# Patient Record
Sex: Female | Born: 1955 | Race: White | Hispanic: No | Marital: Married
Health system: Southern US, Community
[De-identification: ages and names within clinical notes are randomized; demographics above are authoritative.]

---

## 2016-06-19 ENCOUNTER — Emergency Department (HOSPITAL_COMMUNITY)
Admission: EM | Admit: 2016-06-19 | Discharge: 2016-06-19 | Disposition: A | Discharge status: Home / Self Care | Payer: Medicare HMO | Attending: Emergency Medicine | Admitting: Emergency Medicine

## 2016-06-19 ENCOUNTER — Emergency Department (HOSPITAL_COMMUNITY): Payer: Medicare HMO

## 2016-06-19 DIAGNOSIS — Y929 Unspecified place or not applicable: Secondary | ICD-10-CM | POA: Diagnosis not present

## 2016-06-19 DIAGNOSIS — Y939 Activity, unspecified: Secondary | ICD-10-CM | POA: Diagnosis not present

## 2016-06-19 DIAGNOSIS — Y999 Unspecified external cause status: Secondary | ICD-10-CM | POA: Diagnosis not present

## 2016-06-19 DIAGNOSIS — W109XXA Fall (on) (from) unspecified stairs and steps, initial encounter: Secondary | ICD-10-CM | POA: Insufficient documentation

## 2016-06-19 DIAGNOSIS — S8262XA Displaced fracture of lateral malleolus of left fibula, initial encounter for closed fracture: Secondary | ICD-10-CM | POA: Insufficient documentation

## 2016-06-19 DIAGNOSIS — S8011XA Contusion of right lower leg, initial encounter: Secondary | ICD-10-CM | POA: Diagnosis not present

## 2016-06-19 DIAGNOSIS — S99912A Unspecified injury of left ankle, initial encounter: Secondary | ICD-10-CM | POA: Diagnosis present

## 2016-06-19 DIAGNOSIS — S82892A Other fracture of left lower leg, initial encounter for closed fracture: Secondary | ICD-10-CM

## 2016-06-19 MED ORDER — HYDROCODONE-ACETAMINOPHEN 5-325 MG PO TABS: 1.0000 | ORAL_TABLET | Freq: Four times a day (QID) | ORAL | 0 refills | Status: AC | PRN

## 2016-06-19 MED ORDER — HYDROCODONE-ACETAMINOPHEN 5-325 MG PO TABS
1.0000 | ORAL_TABLET | Freq: Once | ORAL | Status: AC
  Administered 2016-06-19: 1 via ORAL
  Filled 2016-06-19: qty 1

## 2016-06-19 MED ORDER — ONDANSETRON HCL 4 MG PO TABS: 4.0000 mg | ORAL_TABLET | Freq: Four times a day (QID) | ORAL | 0 refills | Status: AC | PRN

## 2016-06-19 MED ORDER — ONDANSETRON 4 MG PO TBDP
4.0000 mg | ORAL_TABLET | Freq: Once | ORAL | Status: AC
  Administered 2016-06-19: 4 mg via ORAL
  Filled 2016-06-19: qty 1

## 2016-06-19 MED ORDER — IBUPROFEN 600 MG PO TABS: 600.0000 mg | ORAL_TABLET | Freq: Four times a day (QID) | ORAL | 0 refills | Status: AC | PRN

## 2016-06-19 NOTE — ED Provider Notes (Signed)
[facility]-EMERGENCY DEPT Provider Note   CSN: [id number] Arrival date & time: 06/19/16  2027     History   Chief Complaint Chief Complaint  Patient presents with  . Fall    HPI Catherine Rose is a 61 y.o. female.  HPI Patient presents after fall that happened around 2:30 this afternoon. States she fell down several stairs. Denies hitting her head or any loss of consciousness. She complained of immediate left ankle pain. She's had swelling to the left ankle and the right anterior lower leg. She's had increasing pain to the right leg. States she has pain with attempted to ambulate. She was ambulatory at the time of the fall.  No past medical history on file.  There are no active problems to display for this patient.   No past surgical history on file.  OB History    No data available       Home Medications    Prior to Admission medications   Medication Sig Start Date End Date Taking? Authorizing Provider  alendronate (FOSAMAX) 70 MG tablet Take 70 mg by mouth every Sunday. Take with a full glass of water on an empty stomach.   Yes Historical Provider, MD  Biotin (BIOTIN 5000) 5 MG CAPS Take 5 mg by mouth daily.   Yes Historical Provider, MD  Cyanocobalamin (VITAMIN B-12) 2500 MCG SUBL Place 2,500 mcg under the tongue daily.   Yes Historical Provider, MD  cycloSPORINE (RESTASIS) 0.05 % ophthalmic emulsion Place 1 drop into both eyes 2 (two) times daily.   Yes Historical Provider, MD  furosemide (LASIX) 20 MG tablet Take 20 mg by mouth every Monday, Wednesday, and Friday.   Yes Historical Provider, MD  gabapentin (NEURONTIN) 300 MG capsule Take 300-900 mg by mouth 2 (two) times daily. Pt takes one capsule in the morning and three at night.   Yes Historical Provider, MD  Glatiramer Acetate (COPAXONE) 40 MG/ML SOSY Inject 40 mg into the skin 3 (three) times a week. Pt takes on Sunday, Tuesday, and Thursday.   Yes Historical Provider, MD  lidocaine (LIDODERM) 5 % Place 2-3 patches onto  the skin daily as needed (for pain). Remove & Discard patch within 12 hours or as directed by MD   Yes Historical Provider, MD  modafinil (PROVIGIL) 200 MG tablet Take 200 mg by mouth daily.   Yes Historical Provider, MD  Multiple Vitamin (MULTIVITAMIN WITH MINERALS) TABS tablet Take 2 tablets by mouth daily.   Yes Historical Provider, MD  multivitamin-lutein (OCUVITE-LUTEIN) CAPS capsule Take 1 capsule by mouth daily.   Yes Historical Provider, MD  omega-3 acid ethyl esters (LOVAZA) 1 g capsule Take 2 g by mouth daily.   Yes Historical Provider, MD  pramipexole (MIRAPEX) 1 MG tablet Take 1 mg by mouth at bedtime.   Yes Historical Provider, MD  traZODone (DESYREL) 150 MG tablet Take 150 mg by mouth at bedtime.   Yes Historical Provider, MD  HYDROcodone-acetaminophen (NORCO) 5-325 MG tablet Take 1 tablet by mouth every 6 (six) hours as needed for severe pain. 06/19/16   Loren Racer, MD  ibuprofen (ADVIL,MOTRIN) 600 MG tablet Take 1 tablet (600 mg total) by mouth every 6 (six) hours as needed. 06/19/16   Loren Racer, MD  ondansetron (ZOFRAN) 4 MG tablet Take 1 tablet (4 mg total) by mouth every 6 (six) hours as needed for nausea or vomiting. 06/19/16   Loren Racer, MD    Family History No family history on file.  Social History Social History  Substance Use Topics  . Smoking status: Not on file  . Smokeless tobacco: Not on file  . Alcohol use Not on file     Allergies   Penicillins and Sulfa antibiotics   Review of Systems Review of Systems  Constitutional: Negative for chills and fever.  Respiratory: Negative for shortness of breath.   Cardiovascular: Positive for leg swelling. Negative for chest pain.  Musculoskeletal: Positive for arthralgias. Negative for back pain and neck pain.  Skin: Negative for rash and wound.  Neurological: Negative for weakness and numbness.  All other systems reviewed and are negative.    Physical Exam Updated Vital Signs BP 118/84   Pulse  78   Temp 98.4 F (36.9 C)   Resp 18   Ht 5\' 4"  (1.626 m)   Wt 178 lb (80.7 kg)   SpO2 97%   BMI 30.55 kg/m   Physical Exam  Constitutional: She is oriented to person, place, and time. She appears well-developed and well-nourished. No distress.  HENT:  Head: Normocephalic and atraumatic.  Mouth/Throat: Oropharynx is clear and moist.  Eyes: EOM are normal. Pupils are equal, round, and reactive to light.  Neck: Normal range of motion. Neck supple.  No posterior midline cervical tenderness to palpation  Cardiovascular: Normal rate and regular rhythm.  Exam reveals no gallop and no friction rub.   No murmur heard. Pulmonary/Chest: Effort normal and breath sounds normal. No respiratory distress. She has no wheezes. She has no rales. She exhibits no tenderness.  Abdominal: Soft. Bowel sounds are normal. There is no tenderness. There is no rebound and no guarding.  Musculoskeletal: Normal range of motion. She exhibits tenderness. She exhibits no edema.  Patient with temps palpation over the lateral malleolus of the left ankle. There is mild contusion. No deformity. 2+ dorsalis pedis and posterior tibial pulses bilaterally. Chest full range of motion of the left knee and no proximal fibular tenderness. Patient has swelling and tenderness in the mid anterior lateral tibia region. There is mild swelling at this site. She has full range of motion of the right knee and right ankle. No tenderness to palpation of knee or ankle.  Neurological: She is alert and oriented to person, place, and time.  Skin: Skin is warm and dry. Capillary refill takes less than 2 seconds. No rash noted. No erythema.  Psychiatric: She has a normal mood and affect. Her behavior is normal.  Nursing note and vitals reviewed.    ED Treatments / Results  Labs (all labs ordered are listed, but only abnormal results are displayed) Labs Reviewed - No data to display  EKG  EKG Interpretation None       Radiology No  results found.  Procedures Procedures (including critical care time)  Medications Ordered in ED Medications  HYDROcodone-acetaminophen (NORCO/VICODIN) 5-325 MG per tablet 1 tablet (1 tablet Oral Given 06/19/16 2205)  ondansetron (ZOFRAN-ODT) disintegrating tablet 4 mg (4 mg Oral Given 06/19/16 2205)     Initial Impression / Assessment and Plan / ED Course  I have reviewed the triage vital signs and the nursing notes.  Pertinent labs & imaging results that were available during my care of the patient were reviewed by me and considered in my medical decision making (see chart for details).    Placed in a cam walker. Advised to follow-up with her orthopedist in [place].   Final Clinical Impressions(s) / ED Diagnoses   Final diagnoses:  Closed avulsion fracture of left ankle, initial encounter  Contusion of  right lower leg, initial encounter    New Prescriptions Discharge Medication List as of 06/19/2016 10:47 PM    START taking these medications   Details  HYDROcodone-acetaminophen (NORCO) 5-325 MG tablet Take 1 tablet by mouth every 6 (six) hours as needed for severe pain., Starting Sun 06/19/2016, Print    ibuprofen (ADVIL,MOTRIN) 600 MG tablet Take 1 tablet (600 mg total) by mouth every 6 (six) hours as needed., Starting Sun 06/19/2016, Print    ondansetron (ZOFRAN) 4 MG tablet Take 1 tablet (4 mg total) by mouth every 6 (six) hours as needed for nausea or vomiting., Starting Sun 06/19/2016, Print         Loren Raceravid Eitan Doubleday, MD 06/22/16 [phone]

## 2016-06-19 NOTE — ED Notes (Signed)
Pt is complaining of right upper shin pain and left ankle pain.Pt slipped and fell when going down stairs outside. Pt is unable to put weight on either leg. Hx of osteoporosis. No LOC or lacerations.

## 2016-06-19 NOTE — ED Notes (Signed)
Patient transported to X-ray 

## 2016-06-19 NOTE — ED Notes (Signed)
Patient requesting nausea medication. MD made aware. 

## 2018-04-24 IMAGING — CR DG ANKLE COMPLETE 3+V*L*
3 series · 3 of 3 positions shown · non-contrast
Comparison: None.

CLINICAL DATA: Left lateral ankle pain after slip and fall injury
yesterday.

EXAM:
LEFT ANKLE COMPLETE - 3+ VIEW

[x ankle lat left]
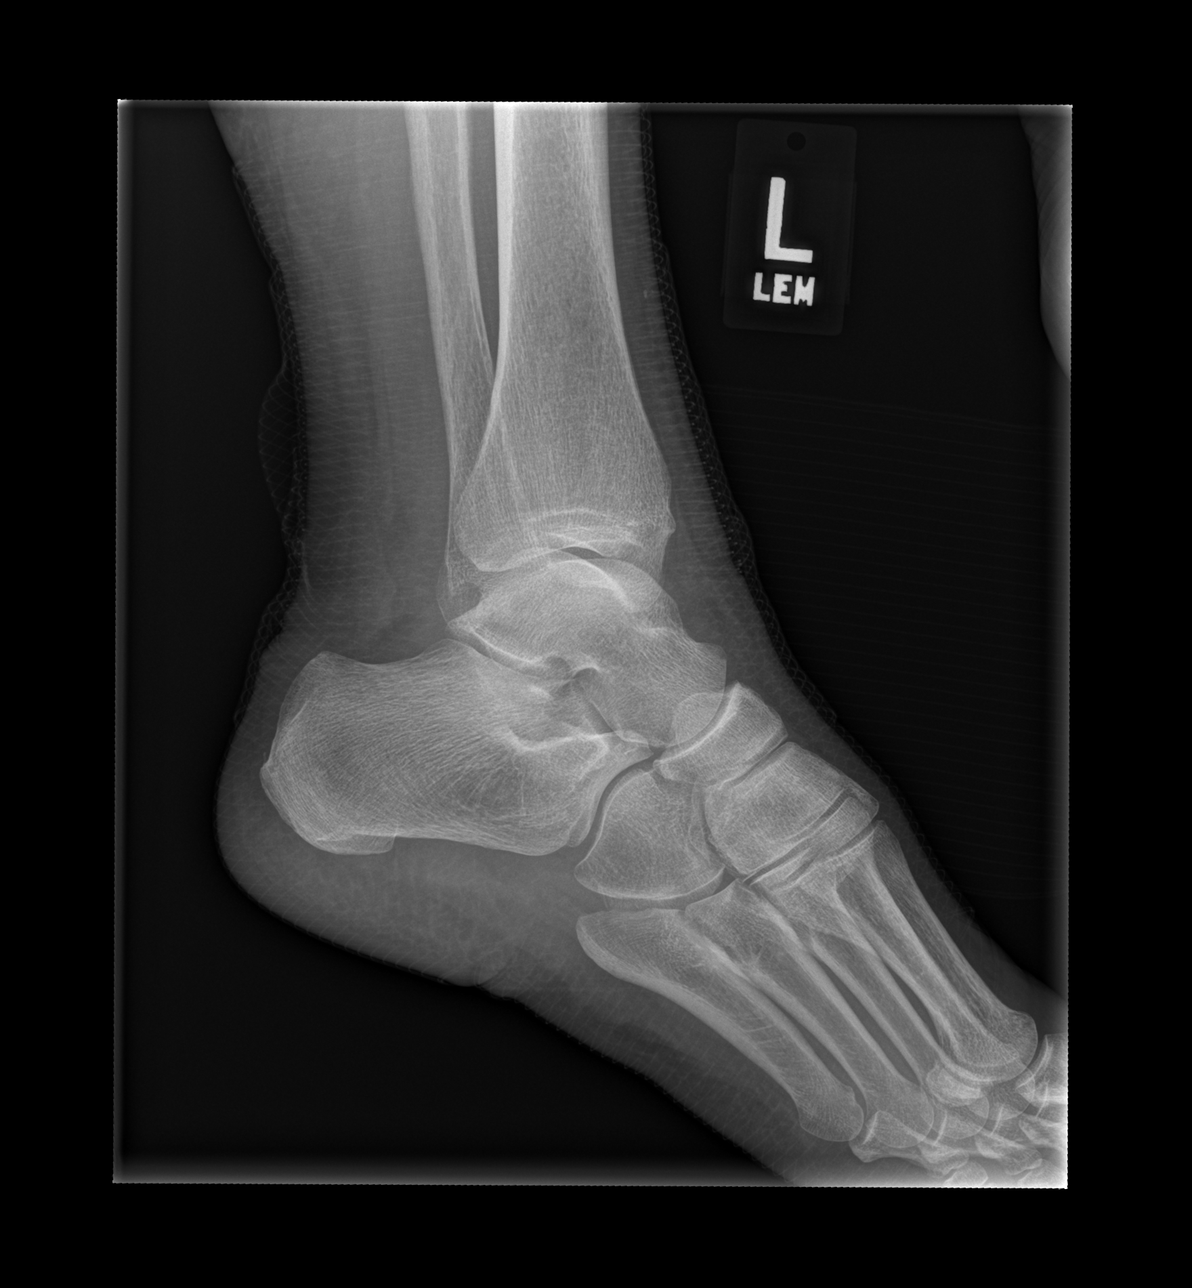

[x ankle ap left]
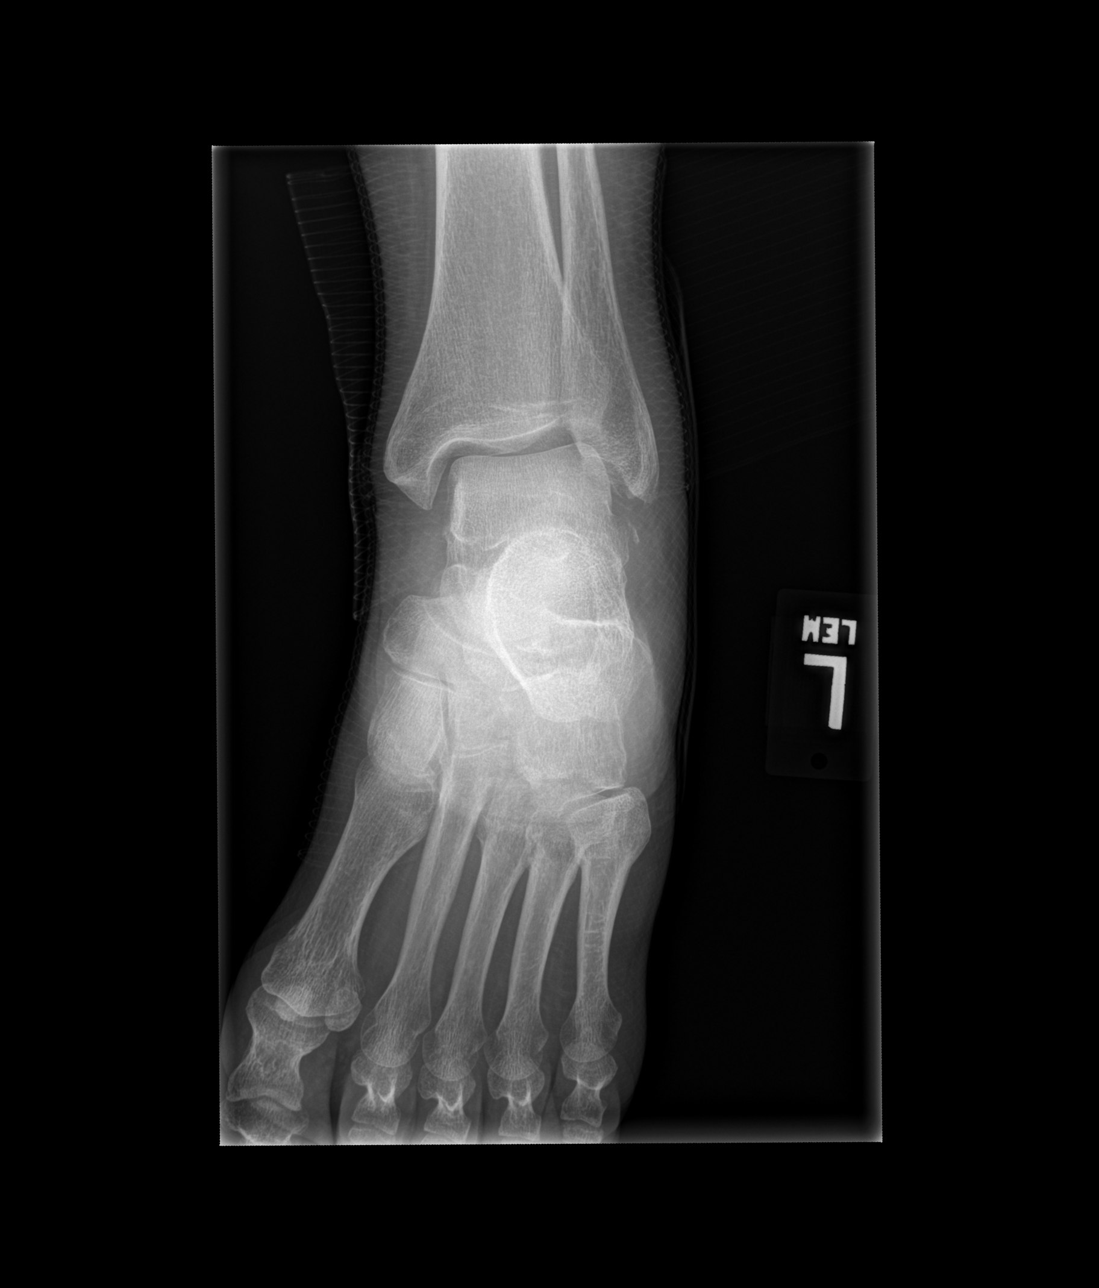

[x ankle obl left]
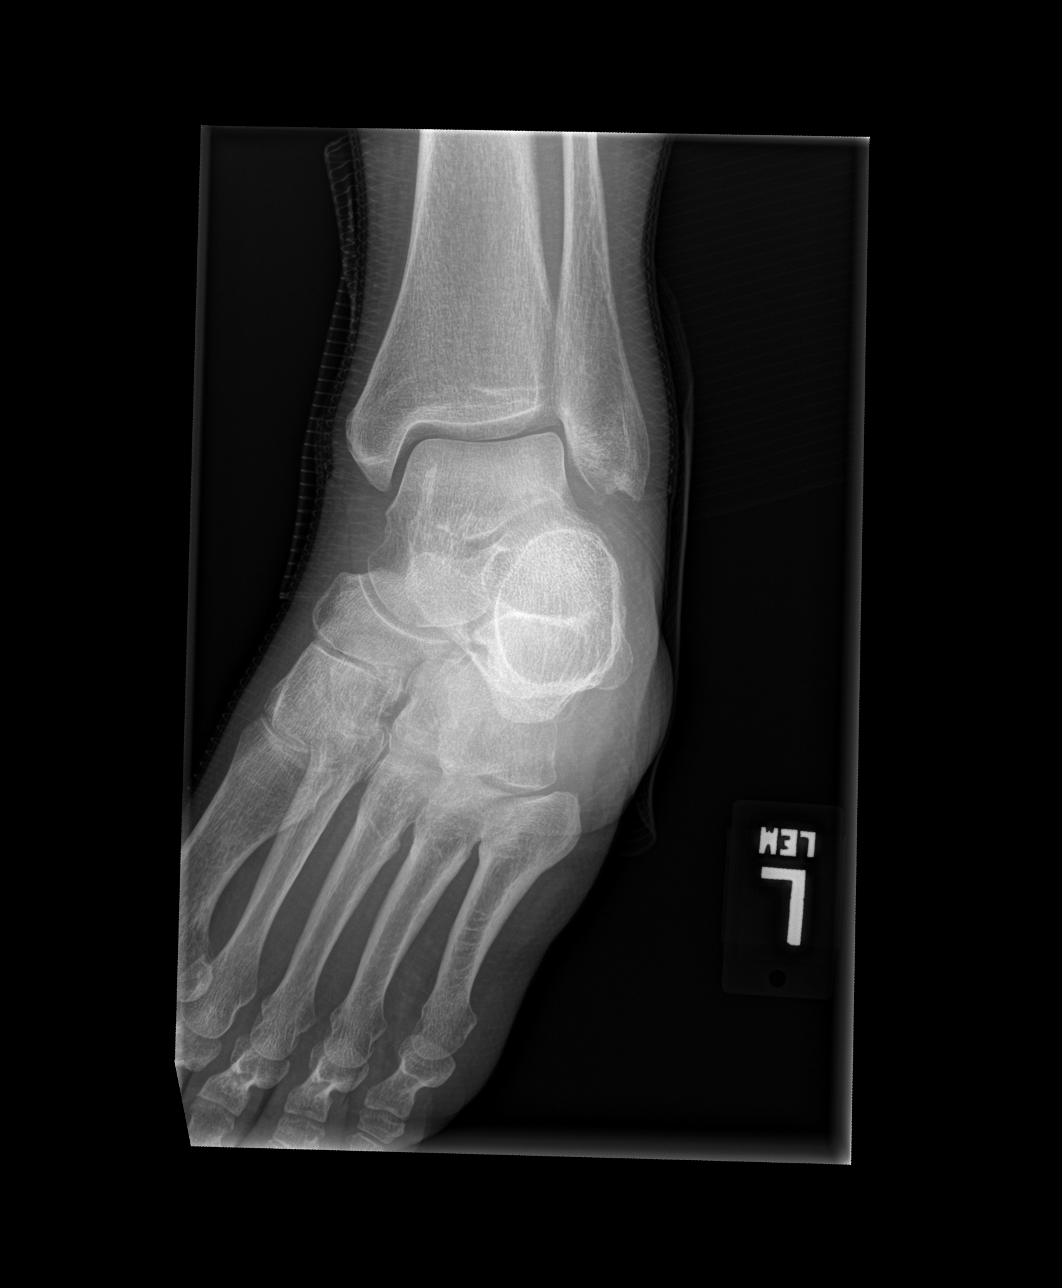

[3 of 3 positions shown; findings below may reference images not displayed]

FINDINGS: Tiny cortical fragments are demonstrated inferior to the lateral
malleolus and over the lateral aspect of the talus of the left
ankle. This is consistent with acute avulsion fractures. Mild soft
tissue swelling. Tibiotalar joint appears intact. No destructive or
expansile bone lesions.
IMPRESSION: Avulsion fracture fragments demonstrated inferior to the lateral
malleolus and over the lateral aspect of the talus of the left
ankle. No dislocation.
